# Patient Record
Sex: Male | Born: 2005 | Race: White | Hispanic: No | Marital: Single
Health system: Southern US, Community
[De-identification: ages and names within clinical notes are randomized; demographics above are authoritative.]

## PROBLEM LIST (undated history)

## (undated) DIAGNOSIS — G43909 Migraine, unspecified, not intractable, without status migrainosus: Secondary | ICD-10-CM

## (undated) DIAGNOSIS — T7840XA Allergy, unspecified, initial encounter: Secondary | ICD-10-CM

## (undated) HISTORY — PX: CIRCUMCISION: SUR203

## (undated) HISTORY — PX: TYMPANOSTOMY TUBE PLACEMENT: SHX32

---
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
## 2023-12-01 ENCOUNTER — Ambulatory Visit
Admission: EM | Admit: 2023-12-01 | Discharge: 2023-12-01 | Disposition: A | Discharge status: Home / Self Care | Payer: MEDICAID | Attending: Emergency Medicine | Admitting: Emergency Medicine

## 2023-12-01 DIAGNOSIS — J36 Peritonsillar abscess: Secondary | ICD-10-CM | POA: Diagnosis not present

## 2023-12-01 DIAGNOSIS — L0231 Cutaneous abscess of buttock: Secondary | ICD-10-CM | POA: Diagnosis not present

## 2023-12-01 MED ORDER — SULFAMETHOXAZOLE-TRIMETHOPRIM 800-160 MG PO TABS: 1.0000 | ORAL_TABLET | Freq: Two times a day (BID) | ORAL | 0 refills | Status: AC

## 2023-12-01 NOTE — ED Triage Notes (Signed)
 Pt states he has a possible boil/abscess on his tailbone/buttocks x 1 day. Pt states it started a year ago and goes to urgent care about every 3-4 months to have it drained/cut open.

## 2023-12-01 NOTE — ED Provider Notes (Signed)
 [facility]    CSN: [id number] Arrival date & time: 12/01/23  0855      History   Chief Complaint Chief Complaint  Patient presents with   Abscess    HPI Ronald Tran is a 18 y.o. male.   HPI  18 year old male with past medical history significant for migraine without order, recurrent tension headaches, and recurrent left buttock abscess presents for evaluation of painful swelling in his left buttock that began yesterday.  He denies any fever or drainage.  He reports that he has to have an abscess lanced in the same location every 3 to 6 months and this has been going on for the last year and a half.  He was last seen at [facility] and was supposed to have a referral to general surgery but no one ever contacted him.  Past Medical History:  Diagnosis Date   Allergies    Migraines     Patient Active Problem List   Diagnosis Date Noted   Migraine without aura and without status migrainosus, not intractable 08/17/2017   Tension headache 08/17/2017   Attention deficit hyperactivity disorder (ADHD), combined type 08/17/2017    Past Surgical History:  Procedure Laterality Date   CIRCUMCISION     TYMPANOSTOMY TUBE PLACEMENT         Home Medications    Prior to Admission medications   Medication Sig Start Date End Date Taking? Authorizing Provider  sulfamethoxazole-trimethoprim (BACTRIM DS) 800-160 MG tablet Take 1 tablet by mouth 2 (two) times daily for 7 days. 12/01/23 12/08/23 Yes Bernardino Ditch, NP  meloxicam  (MOBIC ) 7.5 MG tablet Take 1 tablet (7.5 mg total) by mouth daily as needed for pain. 09/25/18   Cook, Jayce G, DO  rizatriptan  (MAXALT ) 5 MG tablet TAKE 1 TAB BY MOUTH ONCE A DAY AS NEEDED WITHIN 30MINS OF ONSET OF MIGRAINE, MAY RPT AFTER 2 HRS 10/26/17   Corinthia Blossom, MD  amitriptyline  (ELAVIL ) 25 MG tablet Take 1 tablet (25 mg total) by mouth at bedtime. 10/26/17 09/25/18  Corinthia Blossom, MD  CONCERTA 18 MG CR tablet Take 18 mg by mouth  every morning. 08/08/17 09/25/18  [provider]    Family History Family History  Problem Relation Age of Onset   Migraines Paternal Aunt    Migraines Paternal Grandmother    Seizures Neg Hx    Autism Neg Hx    ADD / ADHD Neg Hx    Anxiety disorder Neg Hx    Depression Neg Hx    Bipolar disorder Neg Hx    Schizophrenia Neg Hx     Social History Social History   Tobacco Use   Smoking status: Passive Smoke Exposure - Never Smoker   Smokeless tobacco: Never  Substance Use Topics   Alcohol use: Never   Drug use: Never     Allergies   Patient has no known allergies.   Review of Systems Review of Systems  Constitutional:  Negative for fever.  Skin:  Positive for wound.     Physical Exam Triage Vital Signs ED Triage Vitals  Encounter Vitals Group     BP 12/01/23 0936 (!) 109/56     Girls Systolic BP Percentile --      Girls Diastolic BP Percentile --      Boys Systolic BP Percentile --      Boys Diastolic BP Percentile --      Pulse Rate 12/01/23 0936 72     Resp 12/01/23 0936 18  Temp 12/01/23 0936 98.5 F (36.9 C)     Temp Source 12/01/23 0936 Oral     SpO2 12/01/23 0936 97 %     Weight --      Height --      Head Circumference --      Peak Flow --      Pain Score 12/01/23 0934 8     Pain Loc --      Pain Education --      Exclude from Growth Chart --    No data found.  Updated Vital Signs BP (!) 109/56 (BP Location: Right Arm)   Pulse 72   Temp 98.5 F (36.9 C) (Oral)   Resp 18   SpO2 97%   Visual Acuity Right Eye Distance:   Left Eye Distance:   Bilateral Distance:    Right Eye Near:   Left Eye Near:    Bilateral Near:     Physical Exam Vitals and nursing note reviewed.  Constitutional:      Appearance: He is not ill-appearing.  HENT:     Head: Normocephalic and atraumatic.  Skin:    General: Skin is warm and dry.     Capillary Refill: Capillary refill takes less than 2 seconds.     Findings: Erythema and lesion  present.  Neurological:     General: No focal deficit present.     Mental Status: He is alert and oriented to person, place, and time.      UC Treatments / Results  Labs (all labs ordered are listed, but only abnormal results are displayed) Labs Reviewed - No data to display  EKG   Radiology No results found.  Procedures Procedures (including critical care time)  Medications Ordered in UC Medications - No data to display  Initial Impression / Assessment and Plan / UC Course  I have reviewed the triage vital signs and the nursing notes.  Pertinent labs & imaging results that were available during my care of the patient were reviewed by me and considered in my medical decision making (see chart for details).   Patient is a nontoxic-appearing 18 year old male presenting provide ration of recurrent abscess to buttock as outlined in HPI above.  Patient seen image above, the abscess has recently ruptured and there is a large volume of serosanguineous pus on the patient's bilateral buttock.  Given that it is already ruptured there is no need to perform an I&D.  However, I will refer the patient to general surgery for evaluation given that he has these recurrent abscesses.  I will also start him on Bactrim DS twice daily with 1+ water for 7 days for treatment of his abscess.  He should apply warm compresses to the area to help facilitate drainage.  ER and return precautions reviewed.  Work note provided.   Final Clinical Impressions(s) / UC Diagnoses   Final diagnoses:  Recurrent peritonsillar abscess  Abscess of buttock, left     Discharge Instructions      Take the Bactrim DS twice daily with full glass of water for 7 days for treatment of your abscess.  Apply warm compresses to the abscess to help facilitate drainage.  You may use over-the-counter Tylenol and/or ibuprofen as needed for any fever or pain.  If you have any sharp increase in pain, increasing drainage, or  fevers that do not respond to Tylenol or ibuprofen you need to go to the ER for evaluation.  I have referred you to general surgery for  evaluation and treatment of your recurrent buttock abscess.     ED Prescriptions     Medication Sig Dispense Auth. Provider   sulfamethoxazole-trimethoprim (BACTRIM DS) 800-160 MG tablet Take 1 tablet by mouth 2 (two) times daily for 7 days. 14 tablet Bernardino Ditch, NP      PDMP not reviewed this encounter.   Bernardino Ditch, NP 12/01/23 [phone]

## 2023-12-01 NOTE — Discharge Instructions (Signed)
 Take the Bactrim DS twice daily with full glass of water for 7 days for treatment of your abscess.  Apply warm compresses to the abscess to help facilitate drainage.  You may use over-the-counter Tylenol and/or ibuprofen as needed for any fever or pain.  If you have any sharp increase in pain, increasing drainage, or fevers that do not respond to Tylenol or ibuprofen you need to go to the ER for evaluation.  I have referred you to general surgery for evaluation and treatment of your recurrent buttock abscess.
